# Patient Record
Sex: Female | Born: 1980 | Hispanic: Yes | Marital: Single | Smoking: Never smoker
Health system: Southern US, Community
[De-identification: ages and names within clinical notes are randomized; demographics above are authoritative.]

## PROBLEM LIST (undated history)

## (undated) DIAGNOSIS — C539 Malignant neoplasm of cervix uteri, unspecified: Secondary | ICD-10-CM

## (undated) HISTORY — DX: Malignant neoplasm of cervix uteri, unspecified: C53.9

---

## 2003-09-14 HISTORY — PX: CHOLECYSTECTOMY: SHX55

## 2007-09-14 HISTORY — PX: TUBAL LIGATION: SHX77

## 2013-09-13 DIAGNOSIS — C539 Malignant neoplasm of cervix uteri, unspecified: Secondary | ICD-10-CM

## 2013-09-13 HISTORY — DX: Malignant neoplasm of cervix uteri, unspecified: C53.9

## 2013-09-13 HISTORY — PX: LEEP: SHX91

## 2016-06-24 ENCOUNTER — Encounter: Payer: Self-pay | Admitting: Pediatric Intensive Care

## 2016-06-25 ENCOUNTER — Ambulatory Visit (INDEPENDENT_AMBULATORY_CARE_PROVIDER_SITE_OTHER): Payer: Self-pay | Admitting: Internal Medicine

## 2016-06-25 ENCOUNTER — Encounter: Payer: Self-pay | Admitting: Internal Medicine

## 2016-06-25 VITALS — BP 104/68 | HR 70 | Ht 59.0 in | Wt 128.0 lb

## 2016-06-25 DIAGNOSIS — Z310 Encounter for reversal of previous sterilization: Secondary | ICD-10-CM
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
## 2016-07-22 NOTE — Congregational Nurse Program (Unsigned)
Congregational Nurse Program Note  Date of Encounter: 06/24/2016  Past Medical History: Past Medical History:  Diagnosis Date  . Cervical cancer ([place]) 2015   Underwent LEEP, has had 2 paps since that have normalized    Encounter Details:     CNP Questionnaire - 06/24/16 1500      Patient Demographics   Is this a new or existing patient? New   Patient is considered a/an Immigrant   Race Latino/Hispanic     Patient Assistance   Location of Patient Assistance [employer]   Patient's financial/insurance status Self-Pay (Uninsured)   Uninsured Patient ([place] [employer]/Care Connects) Yes   Interventions Assisted patient in making appt.   Patient referred to apply for the following financial assistance [place] insecurities addressed Not Applicable   Transportation assistance No   Assistance securing medications No   Doctor, hospital the healthcare system     Encounter Details   Primary purpose of visit [place]   Was an Emergency Department visit averted? Not Applicable   Does patient have a medical provider? No   Patient referred to Establish PCP   Was a mental health screening completed? (GAINS tool) No   Does patient have dental issues? No   Does patient have vision issues? No   Does your patient have an abnormal blood pressure today? No   Since previous encounter, have you referred patient for abnormal blood pressure that resulted in a new diagnosis or medication change? No   Does your patient have an abnormal blood glucose today? No   Since previous encounter, have you referred patient for abnormal blood glucose that resulted in a new diagnosis or medication change? No   Was there a life-saving intervention made? No         Amb Nursing Assessment - 06/25/16 1427      Pain Assessment   Pain Assessment No/denies pain     Nutrition Screen   BMI - recorded 25.85   Nutritional Status BMI 25 -29  Overweight   Diabetes No     Functional Status   Activities of Daily Living Independent   Ambulation Independent   Medication Administration Independent   Home Management Independent     Abuse/Neglect Assessment   Do you feel unsafe in your current relationship? No   Do you feel physically threatened by others? No   Anyone hurting you at home, work, or school? No   Unable to ask? No     Investment banker, operational Needed? Yes   Interpreter Name Bishop Limbo (husband) here to interpret   Patient Declined Interpreter  No     New client- via interpreter Heather Torres- client has history of cervical cancer. She states that she would like to speak with a doctor regarding having children. Appointment set up with [facility]. Information given.

## 2018-06-27 ENCOUNTER — Emergency Department (HOSPITAL_COMMUNITY)
Admission: EM | Admit: 2018-06-27 | Discharge: 2018-06-27 | Disposition: A | Discharge status: Home / Self Care | Payer: Self-pay | Attending: Emergency Medicine | Admitting: Emergency Medicine

## 2018-06-27 ENCOUNTER — Encounter (HOSPITAL_COMMUNITY): Payer: Self-pay | Admitting: Emergency Medicine

## 2018-06-27 ENCOUNTER — Other Ambulatory Visit: Payer: Self-pay

## 2018-06-27 DIAGNOSIS — R519 Headache, unspecified: Secondary | ICD-10-CM

## 2018-06-27 DIAGNOSIS — Y929 Unspecified place or not applicable: Secondary | ICD-10-CM | POA: Insufficient documentation

## 2018-06-27 DIAGNOSIS — F633 Trichotillomania: Secondary | ICD-10-CM

## 2018-06-27 DIAGNOSIS — R51 Headache: Secondary | ICD-10-CM | POA: Insufficient documentation

## 2018-06-27 DIAGNOSIS — Y939 Activity, unspecified: Secondary | ICD-10-CM | POA: Insufficient documentation

## 2018-06-27 DIAGNOSIS — S161XXA Strain of muscle, fascia and tendon at neck level, initial encounter: Secondary | ICD-10-CM | POA: Insufficient documentation

## 2018-06-27 DIAGNOSIS — Y999 Unspecified external cause status: Secondary | ICD-10-CM | POA: Insufficient documentation

## 2018-06-27 MED ORDER — KETOROLAC TROMETHAMINE 30 MG/ML IJ SOLN
30.0000 mg | Freq: Once | INTRAMUSCULAR | Status: AC
  Administered 2018-06-27: 30 mg via INTRAMUSCULAR
  Filled 2018-06-27: qty 1

## 2018-06-27 MED ORDER — CYCLOBENZAPRINE HCL 10 MG PO TABS: 10.0000 mg | ORAL_TABLET | Freq: Two times a day (BID) | ORAL | 0 refills | Status: AC | PRN

## 2018-06-27 MED ORDER — IBUPROFEN 600 MG PO TABS: 600.0000 mg | ORAL_TABLET | Freq: Four times a day (QID) | ORAL | 0 refills | Status: AC | PRN

## 2018-06-27 NOTE — Discharge Instructions (Addendum)
Gracias por permitirme cuidardelo hoy en el Departamento de Emergencias.   Tome 600 mg de ibuprofeno por va oral cada 6 horas con alimentos segn sea necesario para el dolor de cabeza o Conservation officer, historic buildings.  No tome naproxeno, Aleve, Motrin u otros AINE mientras est tomando [employer].  Tome 1 comprimido de Flexeril hasta 2 veces al da para ayudar con el dolor muscular y los espasmos.  No trabaje ni conduzca mientras est tomando este medicamento porque puede causar somnolencia.  Aplique hielo durante 15 a 20 minutos con la frecuencia que sea necesario en cualquier rea que est dolorinada.  No aplique hielo directamente sobre la piel.  Deberas usar una bolsa de hielo.  Seguimiento con su proveedor de atencin primaria si sus sntomas no mejoran significativamente en la prxima semana.  Regresa al departamento de urgencias si presentas sntomas nuevos o que empeoran, incluso si presentas visin doble, si te caes y tienes otra lesin en la cabeza, si presentas un nuevo entumecimiento o debilidad, u otros sntomas nuevos, relacionados con el los sntomas.  Thank you for allowing me to care for you today in the Emergency Department.   Take 600 mg of ibuprofen by mouth every 6 hours with food as needed for headache or pain.  Do not take naproxen, Aleve, Motrin, or other NSAIDs while taking this medication.  Take 1 tablet of Flexeril up to 2 times daily to help with muscle pain and spasms.  Do not work or drive while taking this medication because it can cause you to be drowsy.  Apply ice for 15 to 20 minutes as frequently as needed to any areas that are sore.  Do not apply ice directly to the skin.  You should use an ice pack.  Follow-up with your primary care provider if your symptoms do not significantly improve in the next week.  Return to the emergency department if you develop new or worsening symptoms including if you develop double vision, if you fall and have another head injury, if you develop  new numbness or weakness, or other new, concerning symptoms.
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
## 2018-07-10 ENCOUNTER — Emergency Department (HOSPITAL_COMMUNITY)
Admission: EM | Admit: 2018-07-10 | Discharge: 2018-07-11 | Disposition: A | Discharge status: Home / Self Care | Payer: Self-pay | Attending: Emergency Medicine | Admitting: Emergency Medicine

## 2018-07-10 ENCOUNTER — Other Ambulatory Visit: Payer: Self-pay

## 2018-07-10 ENCOUNTER — Encounter (HOSPITAL_COMMUNITY): Payer: Self-pay | Admitting: Emergency Medicine

## 2018-07-10 DIAGNOSIS — R51 Headache: Secondary | ICD-10-CM | POA: Insufficient documentation

## 2018-07-10 DIAGNOSIS — R519 Headache, unspecified: Secondary | ICD-10-CM

## 2018-07-10 DIAGNOSIS — H539 Unspecified visual disturbance: Secondary | ICD-10-CM | POA: Insufficient documentation

## 2018-07-10 DIAGNOSIS — Z79899 Other long term (current) drug therapy: Secondary | ICD-10-CM | POA: Insufficient documentation
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
## 2018-07-11 ENCOUNTER — Emergency Department (HOSPITAL_COMMUNITY): Payer: Self-pay

## 2018-07-11 MED ORDER — PROPARACAINE HCL 0.5 % OP SOLN
1.0000 [drp] | Freq: Once | OPHTHALMIC | Status: AC
  Administered 2018-07-11: 1 [drp] via OPHTHALMIC
  Filled 2018-07-11: qty 15

## 2018-07-11 MED ORDER — HYDROCODONE-ACETAMINOPHEN 5-325 MG PO TABS: 1.0000 | ORAL_TABLET | ORAL | 0 refills | Status: AC | PRN

## 2018-07-11 MED ORDER — HYDROCODONE-ACETAMINOPHEN 5-325 MG PO TABS
1.0000 | ORAL_TABLET | Freq: Once | ORAL | Status: AC
  Administered 2018-07-11: 1 via ORAL
  Filled 2018-07-11: qty 1

## 2018-07-11 NOTE — ED Notes (Signed)
Tono pen and eye drops at bedside.
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
## 2018-07-11 NOTE — Discharge Instructions (Addendum)
Follow up with Dr. Eulas Post for recheck of your vision. Return here if symptoms worsen or for new concern.
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
